# Patient Record
Sex: Male | Born: 2000 | Race: White | Hispanic: No | Marital: Single | Smoking: Never smoker
Health system: Southern US, Community
[De-identification: ages and names within clinical notes are randomized; demographics above are authoritative.]

---
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
## 2021-03-21 IMAGING — CR DG CHEST 2V
2 series · 2 of 2 positions shown · non-contrast
Comparison: None

CLINICAL DATA: Screening for tuberculosis, positive TB skin test

EXAM:
CHEST - 2 VIEW

[w chest pa]
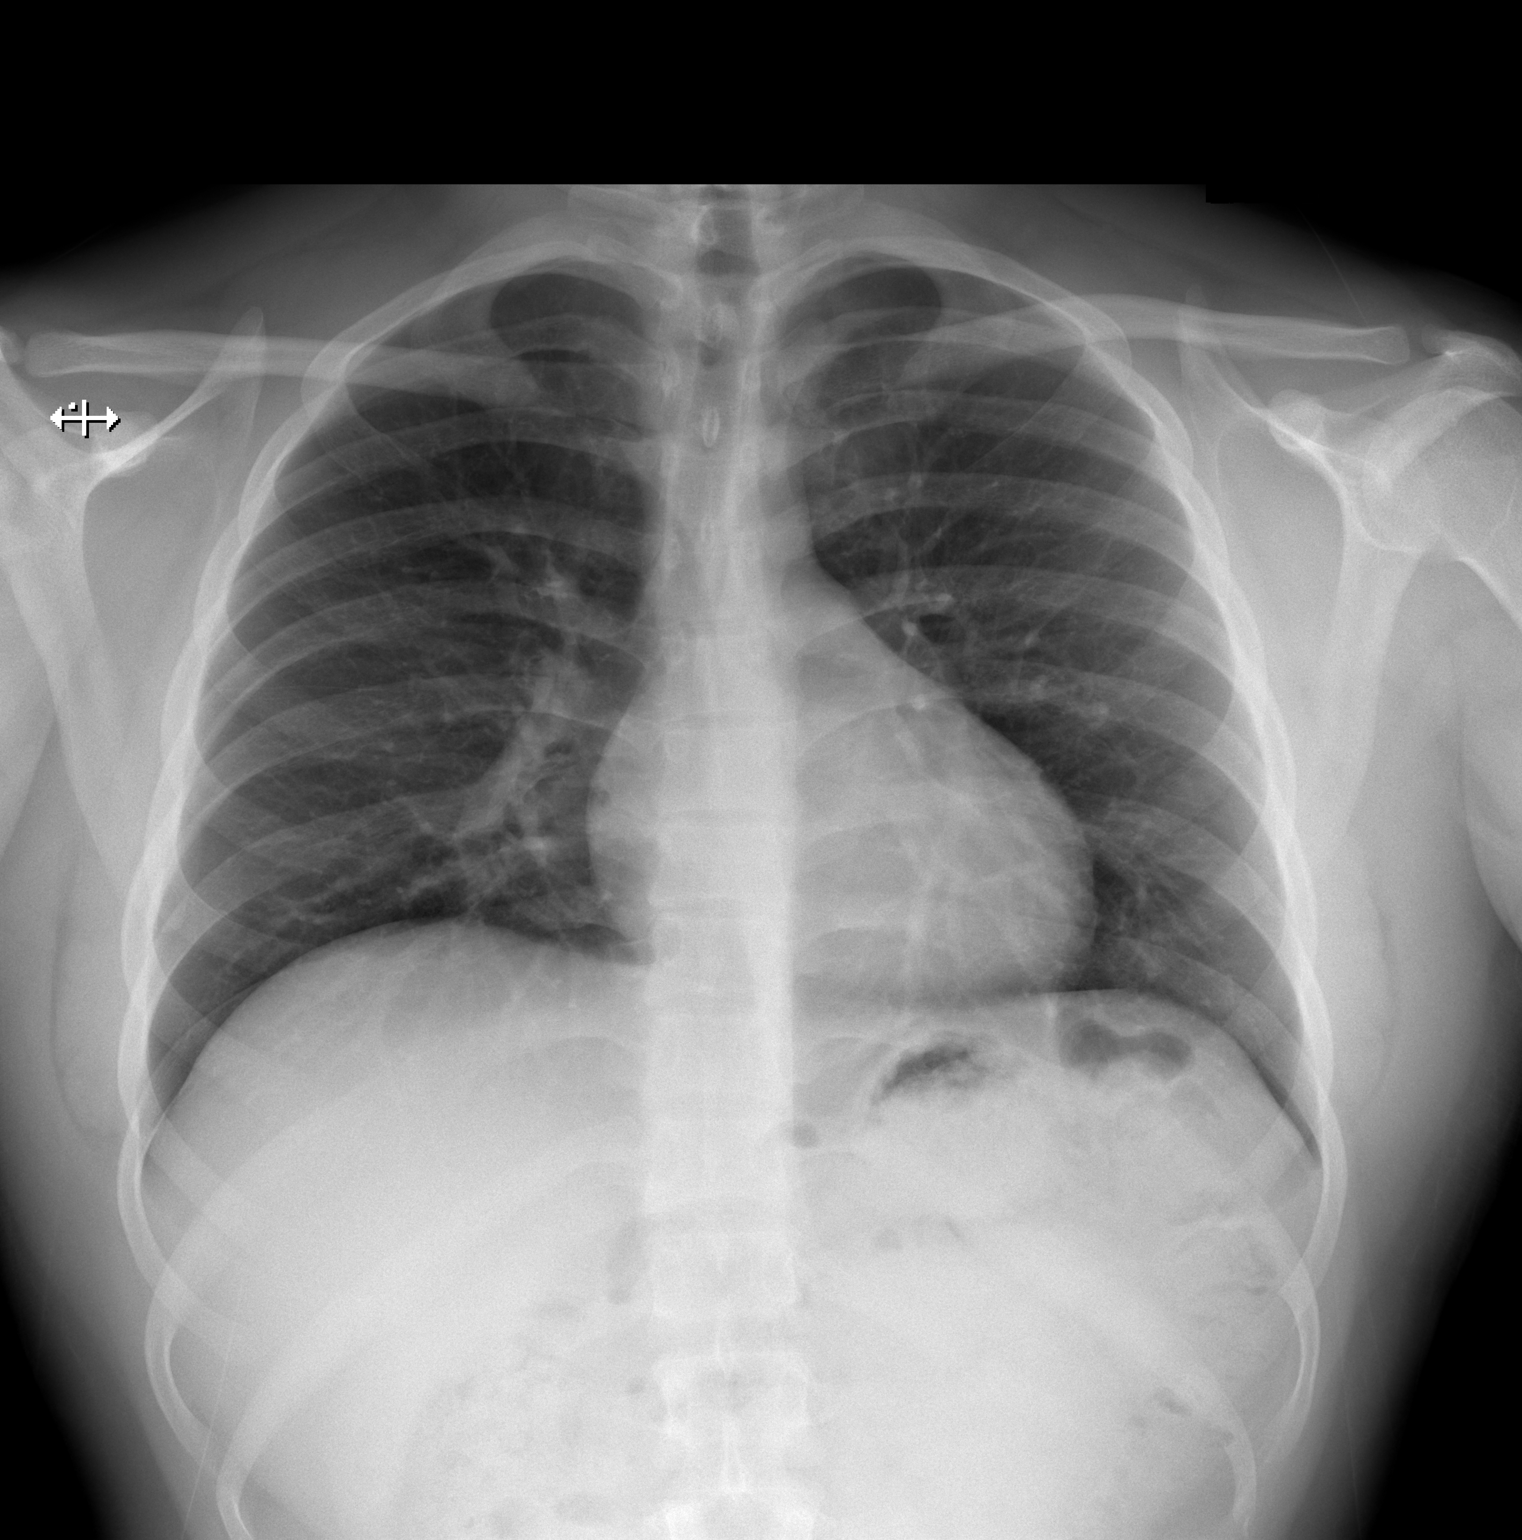

[w chest lat]
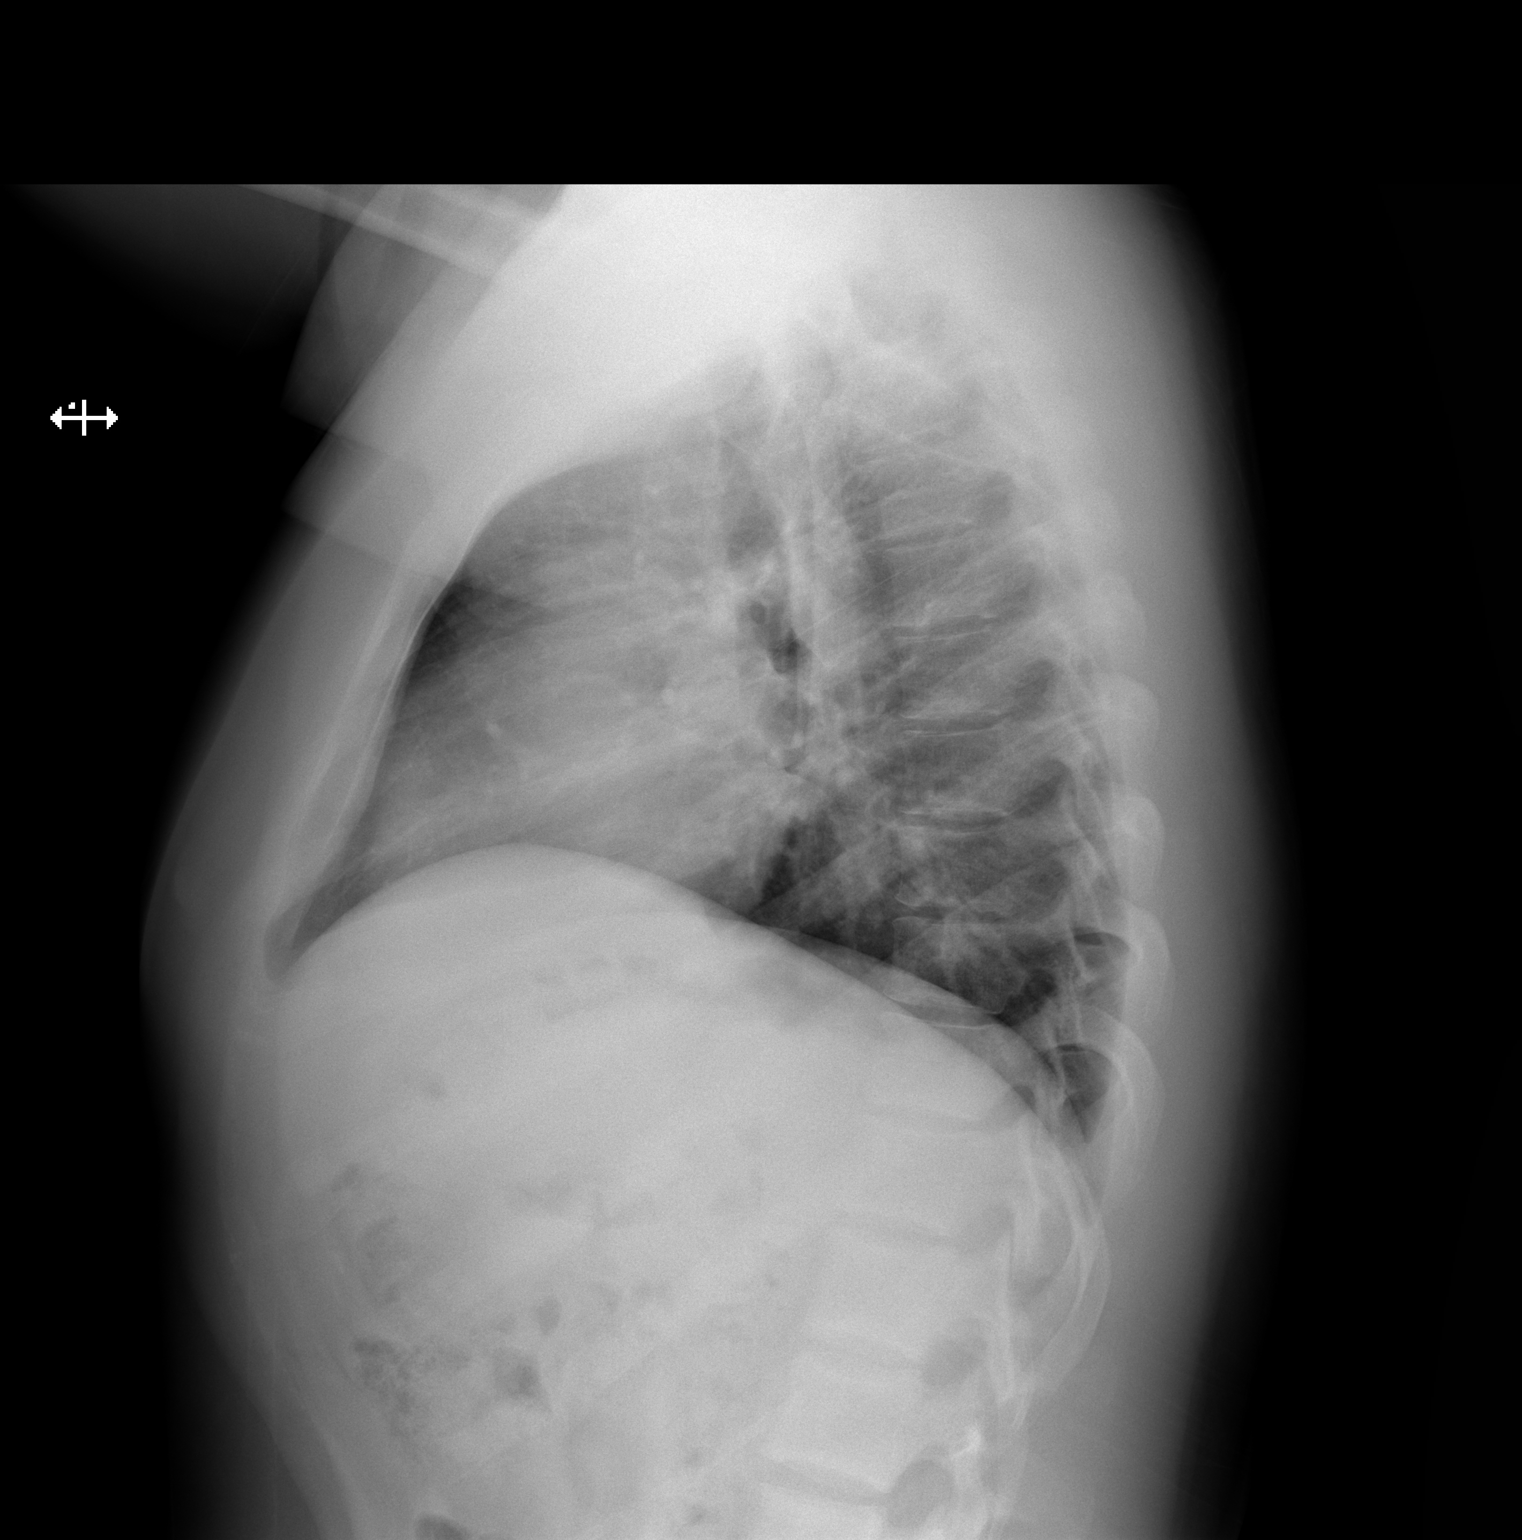

[2 of 2 positions shown; findings below may reference images not displayed]

FINDINGS: Normal heart size, mediastinal contours, and pulmonary vascularity.

Lungs clear.

No pleural effusion or pneumothorax.

Bones unremarkable.
IMPRESSION: Normal exam.
